# Patient Record
Sex: Male | Born: 1985
Health system: Southern US, Community
[De-identification: ages and names within clinical notes are randomized; demographics above are authoritative.]

## PROBLEM LIST (undated history)

## (undated) DIAGNOSIS — T07XXXA Unspecified multiple injuries, initial encounter: Secondary | ICD-10-CM

## (undated) DIAGNOSIS — S0285XA Fracture of orbit, unspecified, initial encounter for closed fracture: Secondary | ICD-10-CM

## (undated) DIAGNOSIS — S025XXA Fracture of tooth (traumatic), initial encounter for closed fracture: Secondary | ICD-10-CM

## (undated) DIAGNOSIS — S0010XA Contusion of unspecified eyelid and periocular area, initial encounter: Secondary | ICD-10-CM

---

## 2011-09-17 DIAGNOSIS — T07XXXA Unspecified multiple injuries, initial encounter: Secondary | ICD-10-CM

## 2011-09-17 DIAGNOSIS — S0010XA Contusion of unspecified eyelid and periocular area, initial encounter: Secondary | ICD-10-CM

## 2011-09-17 DIAGNOSIS — S0285XA Fracture of orbit, unspecified, initial encounter for closed fracture: Secondary | ICD-10-CM | POA: Insufficient documentation

## 2011-09-17 DIAGNOSIS — S025XXA Fracture of tooth (traumatic), initial encounter for closed fracture: Secondary | ICD-10-CM

## 2011-09-17 HISTORY — DX: Fracture of tooth (traumatic), initial encounter for closed fracture: S02.5XXA

## 2011-09-17 HISTORY — DX: Unspecified multiple injuries, initial encounter: T07.XXXA

## 2011-09-17 HISTORY — DX: Fracture of orbit, unspecified, initial encounter for closed fracture: S02.85XA

## 2011-09-17 HISTORY — DX: Contusion of unspecified eyelid and periocular area, initial encounter: S00.10XA

## 2011-09-18 ENCOUNTER — Emergency Department (HOSPITAL_COMMUNITY): Payer: BC Managed Care – PPO

## 2011-09-18 ENCOUNTER — Emergency Department (HOSPITAL_COMMUNITY)
Admission: EM | Admit: 2011-09-18 | Discharge: 2011-09-18 | Disposition: A | Discharge status: Home / Self Care | Payer: BC Managed Care – PPO | Attending: Emergency Medicine | Admitting: Emergency Medicine

## 2011-09-18 ENCOUNTER — Encounter: Payer: Self-pay | Admitting: Emergency Medicine

## 2011-09-18 DIAGNOSIS — IMO0002 Reserved for concepts with insufficient information to code with codable children: Secondary | ICD-10-CM | POA: Insufficient documentation

## 2011-09-18 DIAGNOSIS — W010XXA Fall on same level from slipping, tripping and stumbling without subsequent striking against object, initial encounter: Secondary | ICD-10-CM | POA: Insufficient documentation

## 2011-09-18 DIAGNOSIS — S025XXA Fracture of tooth (traumatic), initial encounter for closed fracture: Secondary | ICD-10-CM | POA: Insufficient documentation

## 2011-09-18 DIAGNOSIS — S0081XA Abrasion of other part of head, initial encounter: Secondary | ICD-10-CM

## 2011-09-18 DIAGNOSIS — S022XXA Fracture of nasal bones, initial encounter for closed fracture: Secondary | ICD-10-CM | POA: Insufficient documentation

## 2011-09-18 DIAGNOSIS — S0230XA Fracture of orbital floor, unspecified side, initial encounter for closed fracture: Secondary | ICD-10-CM | POA: Insufficient documentation

## 2011-09-18 DIAGNOSIS — F1010 Alcohol abuse, uncomplicated: Secondary | ICD-10-CM | POA: Insufficient documentation

## 2011-09-18 MED ORDER — AMOXICILLIN 500 MG PO CAPS: 500.0000 mg | ORAL_CAPSULE | Freq: Three times a day (TID) | ORAL | Status: DC

## 2011-09-18 MED ORDER — BACITRACIN ZINC 500 UNIT/GM EX OINT
TOPICAL_OINTMENT | Freq: Two times a day (BID) | CUTANEOUS | Status: DC
  Administered 2011-09-18: 1 via TOPICAL
  Filled 2011-09-18: qty 0.9

## 2011-09-18 MED ORDER — OXYCODONE-ACETAMINOPHEN 5-325 MG PO TABS: 1.0000 | ORAL_TABLET | Freq: Four times a day (QID) | ORAL | Status: DC | PRN
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
## 2011-09-18 NOTE — ED Provider Notes (Signed)
Medical screening examination/treatment/procedure(s) were conducted as a shared visit with non-physician practitioner(s) and myself.  I personally evaluated the patient during the encounter  Dental procedure: Rennis Harding class 2 fracture of upper incisors dycal placed to each tooth without any immediate complications  Joya Gaskins, MD 09/18/11 [phone]

## 2011-09-18 NOTE — ED Notes (Signed)
Pt given discharge instructions and verbalizes understanding  
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
## 2011-09-21 ENCOUNTER — Other Ambulatory Visit: Payer: Self-pay | Admitting: Specialist

## 2011-09-21 ENCOUNTER — Encounter (HOSPITAL_BASED_OUTPATIENT_CLINIC_OR_DEPARTMENT_OTHER): Payer: Self-pay | Admitting: *Deleted

## 2011-09-21 NOTE — H&P (Signed)
Lenny Estala is an 26 y.o. male.   Chief Complaint: Right blow out fracture HPI:   No past medical history on file.  No past surgical history on file.  No family history on file. Social History:  does not have a smoking history on file. He does not have any smokeless tobacco history on file. He reports that he drinks alcohol. He reports that he does not use illicit drugs.  Allergies:  Allergies  Allergen Reactions  . Zithromax (Azithromycin Dihydrate)     Medications Prior to Admission  Medication Sig Dispense Refill  . amoxicillin (AMOXIL) 500 MG capsule Take 1 capsule (500 mg total) by mouth 3 (three) times daily.  21 capsule  0  . oxyCODONE-acetaminophen (PERCOCET) 5-325 MG per tablet Take 1-2 tablets by mouth every 6 (six) hours as needed for pain.  20 tablet  0   No current facility-administered medications on file as of 09/21/2011.    No results found for this or any previous visit (from the past 48 hour(s)). No results found.  ROS  There were no vitals taken for this visit. Physical Exam   Assessment/Plan   Gwendolynn Merkey L 09/21/2011, 3:05 PM    

## 2011-09-24 ENCOUNTER — Ambulatory Visit (HOSPITAL_BASED_OUTPATIENT_CLINIC_OR_DEPARTMENT_OTHER): Payer: BC Managed Care – PPO | Admitting: *Deleted

## 2011-09-24 ENCOUNTER — Encounter (HOSPITAL_BASED_OUTPATIENT_CLINIC_OR_DEPARTMENT_OTHER)
Admission: RE | Disposition: A | Discharge status: Home / Self Care | Payer: Self-pay | Source: Ambulatory Visit | Attending: Specialist

## 2011-09-24 ENCOUNTER — Encounter (HOSPITAL_BASED_OUTPATIENT_CLINIC_OR_DEPARTMENT_OTHER): Payer: Self-pay | Admitting: *Deleted

## 2011-09-24 ENCOUNTER — Encounter (HOSPITAL_BASED_OUTPATIENT_CLINIC_OR_DEPARTMENT_OTHER): Payer: Self-pay | Admitting: Specialist

## 2011-09-24 ENCOUNTER — Ambulatory Visit (HOSPITAL_BASED_OUTPATIENT_CLINIC_OR_DEPARTMENT_OTHER)
Admission: RE | Admit: 2011-09-24 | Discharge: 2011-09-24 | Disposition: A | Discharge status: Home / Self Care | Payer: BC Managed Care – PPO | Source: Ambulatory Visit | Attending: Specialist | Admitting: Specialist

## 2011-09-24 DIAGNOSIS — W19XXXA Unspecified fall, initial encounter: Secondary | ICD-10-CM | POA: Insufficient documentation

## 2011-09-24 DIAGNOSIS — S0230XA Fracture of orbital floor, unspecified side, initial encounter for closed fracture: Secondary | ICD-10-CM | POA: Insufficient documentation

## 2011-09-24 HISTORY — DX: Fracture of tooth (traumatic), initial encounter for closed fracture: S02.5XXA

## 2011-09-24 HISTORY — DX: Contusion of unspecified eyelid and periocular area, initial encounter: S00.10XA

## 2011-09-24 HISTORY — DX: Unspecified multiple injuries, initial encounter: T07.XXXA

## 2011-09-24 HISTORY — PX: ORIF ZYGOMATIC FRACTURE: SHX2134

## 2011-09-24 HISTORY — DX: Fracture of orbit, unspecified, initial encounter for closed fracture: S02.85XA

## 2011-09-24 SURGERY — OPEN REDUCTION INTERNAL FIXATION (ORIF) ZYGOMATIC FRACTURE
Anesthesia: General | Laterality: Right | Wound class: Clean

## 2011-09-24 MED ORDER — PROMETHAZINE HCL 25 MG/ML IJ SOLN: 6.2500 mg | INTRAMUSCULAR | Status: DC | PRN

## 2011-09-24 MED ORDER — LIDOCAINE HCL (CARDIAC) 20 MG/ML IV SOLN
INTRAVENOUS | Status: DC | PRN
  Administered 2011-09-24: 60 mg via INTRAVENOUS

## 2011-09-24 MED ORDER — GLYCOPYRROLATE 0.2 MG/ML IJ SOLN
INTRAMUSCULAR | Status: DC | PRN
  Administered 2011-09-24: .4 mg via INTRAVENOUS

## 2011-09-24 MED ORDER — HYDROMORPHONE HCL PF 1 MG/ML IJ SOLN
0.2500 mg | INTRAMUSCULAR | Status: DC | PRN
  Administered 2011-09-24 (×2): 0.5 mg via INTRAVENOUS

## 2011-09-24 MED ORDER — MEPERIDINE HCL 25 MG/ML IJ SOLN: 6.2500 mg | INTRAMUSCULAR | Status: DC | PRN

## 2011-09-24 MED ORDER — CEFAZOLIN SODIUM 1-5 GM-% IV SOLN
1.0000 g | INTRAVENOUS | Status: AC
  Administered 2011-09-24: 2 g via INTRAVENOUS

## 2011-09-24 MED ORDER — LIDOCAINE-EPINEPHRINE 0.5-1:200000 % IJ SOLN
INTRAMUSCULAR | Status: DC | PRN
  Administered 2011-09-24: 10 mL

## 2011-09-24 MED ORDER — FENTANYL CITRATE 0.05 MG/ML IJ SOLN
INTRAMUSCULAR | Status: DC | PRN
  Administered 2011-09-24: 100 ug via INTRAVENOUS

## 2011-09-24 MED ORDER — PROPOFOL 10 MG/ML IV EMUL
INTRAVENOUS | Status: DC | PRN
  Administered 2011-09-24: 200 mg via INTRAVENOUS

## 2011-09-24 MED ORDER — OXYCODONE-ACETAMINOPHEN 5-325 MG PO TABS
1.0000 | ORAL_TABLET | Freq: Once | ORAL | Status: AC
  Administered 2011-09-24: 1 via ORAL

## 2011-09-24 MED ORDER — MORPHINE SULFATE 2 MG/ML IJ SOLN: 0.0500 mg/kg | INTRAMUSCULAR | Status: DC | PRN

## 2011-09-24 MED ORDER — ROCURONIUM BROMIDE 100 MG/10ML IV SOLN
INTRAVENOUS | Status: DC | PRN
  Administered 2011-09-24: 40 mg via INTRAVENOUS

## 2011-09-24 MED ORDER — DEXTROSE IN LACTATED RINGERS 5 % IV SOLN: INTRAVENOUS | Status: DC

## 2011-09-24 MED ORDER — LACTATED RINGERS IV SOLN
INTRAVENOUS | Status: DC
  Administered 2011-09-24: 10:00:00 via INTRAVENOUS

## 2011-09-24 MED ORDER — ONDANSETRON HCL 4 MG/2ML IJ SOLN
INTRAMUSCULAR | Status: DC | PRN
  Administered 2011-09-24: 4 mg via INTRAVENOUS

## 2011-09-24 MED ORDER — DEXAMETHASONE SODIUM PHOSPHATE 4 MG/ML IJ SOLN
INTRAMUSCULAR | Status: DC | PRN
  Administered 2011-09-24: 10 mg via INTRAVENOUS

## 2011-09-24 MED ORDER — NEOSTIGMINE METHYLSULFATE 1 MG/ML IJ SOLN
INTRAMUSCULAR | Status: DC | PRN
  Administered 2011-09-24: 3 mg via INTRAVENOUS

## 2011-09-24 MED ORDER — MIDAZOLAM HCL 5 MG/5ML IJ SOLN
INTRAMUSCULAR | Status: DC | PRN
  Administered 2011-09-24: 2 mg via INTRAVENOUS

## 2011-09-24 SURGICAL SUPPLY — 26 items
BLADE KNIFE PERSONA 15 (BLADE) ×2 IMPLANT
CANISTER SUCTION 1200CC (MISCELLANEOUS) ×2 IMPLANT
CLOTH BEACON ORANGE TIMEOUT ST (SAFETY) ×2 IMPLANT
CONFORMERS SILICONE 5649 ×2 IMPLANT
COVER MAYO STAND STRL (DRAPES) ×2 IMPLANT
DRAPE U-SHAPE 76X120 STRL (DRAPES) ×2 IMPLANT
ELECT NEEDLE TIP 2.8 STRL (NEEDLE) ×2 IMPLANT
ELECT REM PT RETURN 9FT ADLT (ELECTROSURGICAL) ×2
ELECTRODE REM PT RTRN 9FT ADLT (ELECTROSURGICAL) ×1 IMPLANT
GLOVE BIO SURGEON STRL SZ 6.5 (GLOVE) ×2 IMPLANT
GLOVE BIOGEL M STRL SZ7.5 (GLOVE) ×2 IMPLANT
GLOVE INDICATOR 8.0 STRL GRN (GLOVE) ×2 IMPLANT
GOWN PREVENTION PLUS XXLARGE (GOWN DISPOSABLE) ×4 IMPLANT
NEEDLE HYPO 25X1 1.5 SAFETY (NEEDLE) ×2 IMPLANT
NS IRRIG 1000ML POUR BTL (IV SOLUTION) ×2 IMPLANT
PACK BASIN DAY SURGERY FS (CUSTOM PROCEDURE TRAY) ×2 IMPLANT
SHEET SIL 8X6X.020 ×2 IMPLANT
SPONGE GAUZE 4X4 12PLY (GAUZE/BANDAGES/DRESSINGS) ×2 IMPLANT
STRIP CLOSURE SKIN 1/4X4 (GAUZE/BANDAGES/DRESSINGS) ×2 IMPLANT
SUCTION FRAZIER TIP 10 FR DISP (SUCTIONS) ×2 IMPLANT
SUT MON AB 5-0 PS2 18 (SUTURE) ×2 IMPLANT
SUT SILK 6 0 P 1 (SUTURE) ×2 IMPLANT
SYR BULB 3OZ (MISCELLANEOUS) ×2 IMPLANT
SYR CONTROL 10ML LL (SYRINGE) ×2 IMPLANT
TUBE CONNECTING 20X1/4 (TUBING) ×2 IMPLANT
WATER STERILE IRR 1000ML POUR (IV SOLUTION) ×2 IMPLANT

## 2011-09-24 NOTE — Interval H&P Note (Signed)
History and Physical Interval Note:  09/24/2011 11:29 AM  Dennis Bennett  has presented today for surgery, with the diagnosis of right blowout fracture   The various methods of treatment have been discussed with the patient and family. After consideration of risks, benefits and other options for treatment, the patient has consented to  Procedure(s): OPEN REDUCTION INTERNAL FIXATION (ORIF) ZYGOMATIC FRACTURE as a surgical intervention .  The patients' history has been reviewed, patient examined, no change in status, stable for surgery.  I have reviewed the patients' chart and labs.  Questions were answered to the patient's satisfaction.     [name] L

## 2011-09-24 NOTE — Brief Op Note (Signed)
09/24/2011  12:27 PM  PATIENT:  Dennis Bennett  26 y.o. male  PRE-OPERATIVE DIAGNOSIS:  right blowout fracture   POST-OPERATIVE DIAGNOSIS:  right blowout fracture   PROCEDURE:  Procedure(s): OPEN REDUCTION INTERNAL FIXATION (ORIF) ZYGOMATIC FRACTURE  SURGEON:  Surgeon(s): Yaakov Guthrie Calee Nugent  PHYSICIAN ASSISTANT:   ASSISTANTS: none   ANESTHESIA:   general  ebl 50 cc  BLOOD ADMINISTERED:00000cc CC PRBC  DRAINS: none   LOCAL MEDICATIONS USED:  LIDOCAINE 10CC  SPECIMEN:  No Specimen  DISPOSITION OF SPECIMEN:  N/A  COUNTS:  YES  TOURNIQUET:  * No tourniquets in log *  DICTATION: .Other Dictation: Dictation Number [id number]  PLAN OF CARE: Discharge to home after PACU  PATIENT DISPOSITION:  PACU - hemodynamically stable.   Delay start of Pharmacological VTE agent (>24hrs) due to surgical blood loss or risk of bleeding:  {YES/NO/NOT APPLICABLE:20182

## 2011-09-24 NOTE — Transfer of Care (Signed)
Immediate Anesthesia Transfer of Care Note  Patient: Dennis Bennett  Procedure(s) Performed:  OPEN REDUCTION INTERNAL FIXATION (ORIF) ZYGOMATIC FRACTURE - open reduction right blowout fracture  Patient Location: PACU  Anesthesia Type: General  Level of Consciousness: awake  Airway & Oxygen Therapy: Patient Spontanous Breathing and Patient connected to face mask oxygen  Post-op Assessment: Report given to PACU RN and Post -op Vital signs reviewed and stable  Post vital signs: Reviewed and stable  Complications: No apparent anesthesia complications

## 2011-09-24 NOTE — H&P (View-Only) (Signed)
Dennis Bennett is an 26 y.o. male.   Chief Complaint: Right blow out fracture HPI:   No past medical history on file.  No past surgical history on file.  No family history on file. Social History:  does not have a smoking history on file. He does not have any smokeless tobacco history on file. He reports that he drinks alcohol. He reports that he does not use illicit drugs.  Allergies:  Allergies  Allergen Reactions  . Zithromax (Azithromycin Dihydrate)     Medications Prior to Admission  Medication Sig Dispense Refill  . amoxicillin (AMOXIL) 500 MG capsule Take 1 capsule (500 mg total) by mouth 3 (three) times daily.  21 capsule  0  . oxyCODONE-acetaminophen (PERCOCET) 5-325 MG per tablet Take 1-2 tablets by mouth every 6 (six) hours as needed for pain.  20 tablet  0   No current facility-administered medications on file as of 09/21/2011.    No results found for this or any previous visit (from the past 48 hour(s)). No results found.  ROS  There were no vitals taken for this visit. Physical Exam   Assessment/Plan   Shoni Quijas L 09/21/2011, 3:05 PM

## 2011-09-24 NOTE — Anesthesia Preprocedure Evaluation (Addendum)
Anesthesia Evaluation  Patient identified by MRN, date of birth, ID band Patient awake    Reviewed: Allergy & Precautions, H&P , NPO status , Patient's Chart, lab work & pertinent test results  Airway Mallampati: II      Dental  (+)    Pulmonary asthma ,  clear to auscultation        Cardiovascular neg cardio ROS Regular Normal    Neuro/Psych    GI/Hepatic negative GI ROS,   Endo/Other  Negative Endocrine ROS  Renal/GU negative Renal ROS     Musculoskeletal negative musculoskeletal ROS (+)   Abdominal   Peds  Hematology negative hematology ROS (+)   Anesthesia Other Findings Both upper front teeth broken off "in accident"  Reproductive/Obstetrics                          Anesthesia Physical Anesthesia Plan  ASA: II  Anesthesia Plan: General   Post-op Pain Management:    Induction: Intravenous  Airway Management Planned: Oral ETT  Additional Equipment:   Intra-op Plan:   Post-operative Plan: Extubation in OR  Informed Consent: I have reviewed the patients History and Physical, chart, labs and discussed the procedure including the risks, benefits and alternatives for the proposed anesthesia with the patient or authorized representative who has indicated his/her understanding and acceptance.   Dental Advisory Given  Plan Discussed with: CRNA  Anesthesia Plan Comments:       Anesthesia Quick Evaluation

## 2011-09-24 NOTE — Anesthesia Postprocedure Evaluation (Signed)
  Anesthesia Post-op Note  Patient: Dennis Bennett  Procedure(s) Performed:  OPEN REDUCTION INTERNAL FIXATION (ORIF) ZYGOMATIC FRACTURE - open reduction right blowout fracture  Patient Location: PACU  Anesthesia Type: General  Level of Consciousness: awake  Airway and Oxygen Therapy: Patient Spontanous Breathing  Post-op Pain: mild  Post-op Assessment: Post-op Vital signs reviewed  Post-op Vital Signs: stable  Complications: No apparent anesthesia complications

## 2011-09-24 NOTE — Anesthesia Procedure Notes (Signed)
Procedure Name: Intubation Performed by: Sharyne Richters Pre-anesthesia Checklist: Patient identified, Timeout performed, Emergency Drugs available, Suction available and Patient being monitored Patient Re-evaluated:Patient Re-evaluated prior to inductionOxygen Delivery Method: Circle System Utilized Preoxygenation: Pre-oxygenation with 100% oxygen Intubation Type: IV induction Ventilation: Mask ventilation without difficulty Laryngoscope Size: Miller and 2 Grade View: Grade I Tube type: Oral Tube size: 8.0 mm Number of attempts: 1 Placement Confirmation: ETT inserted through vocal cords under direct vision,  breath sounds checked- equal and bilateral and positive ETCO2 Secured at: 23 cm Tube secured with: Tape Dental Injury: Teeth and Oropharynx as per pre-operative assessment

## 2011-09-25 ENCOUNTER — Encounter (HOSPITAL_BASED_OUTPATIENT_CLINIC_OR_DEPARTMENT_OTHER): Payer: Self-pay | Admitting: Specialist

## 2011-09-25 LAB — POCT HEMOGLOBIN-HEMACUE: Hemoglobin: 16 g/dL (ref 13.0–17.0)
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
## 2012-06-07 IMAGING — CT CT MAXILLOFACIAL W/O CM
1 series · 15 of 30 positions shown, 19 images · non-contrast
Comparison: None.

CLINICAL DATA: The patient fell and hit the right side of the face.
Pain and swelling over the right eye and right face.

CT MAXILLOFACIAL WITHOUT CONTRAST
TECHNIQUE: Multidetector CT imaging of the maxillofacial
structures was performed. Multiplanar CT image reconstructions were
also generated.

[Series 3: facial st · axial · 0.37mm/px · z∈[-315,-159]mm · 15 of 84 slices shown, 19 images]
[im 3/84  brain]
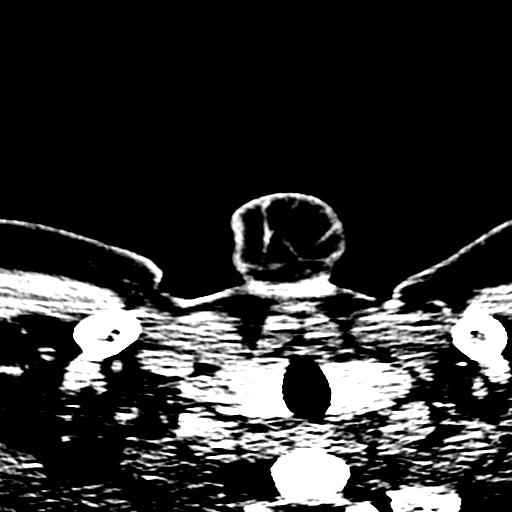
[im 3/84  bone]
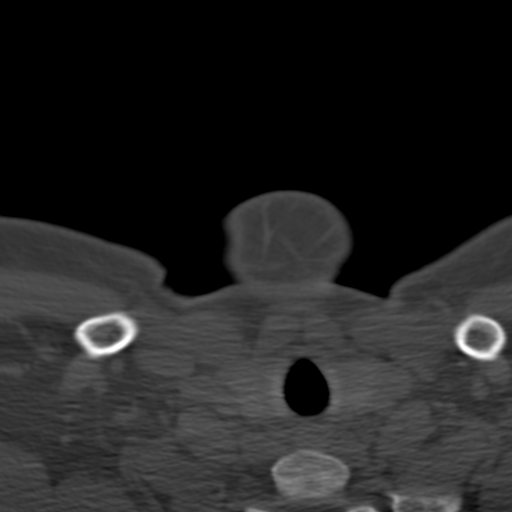
[im 9/84  bone]
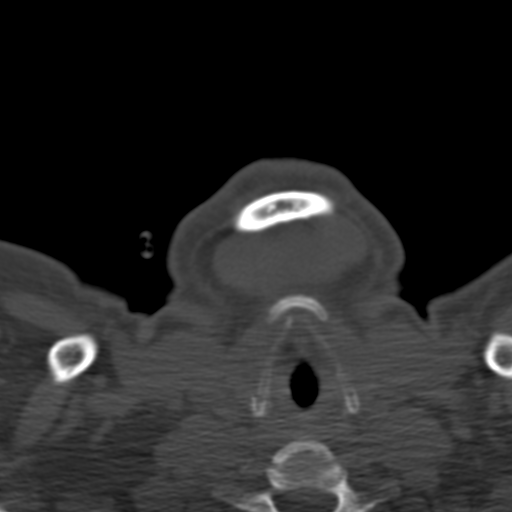
[im 15/84  bone]
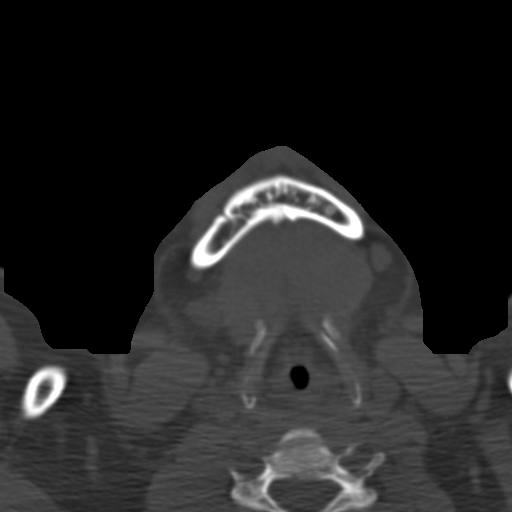
[im 21/84  bone]
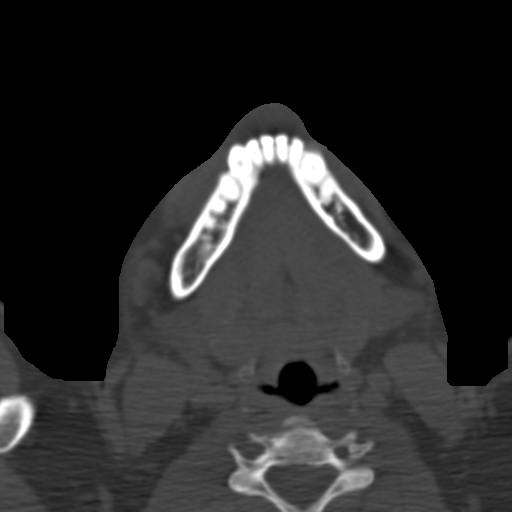
[im 26/84  brain]
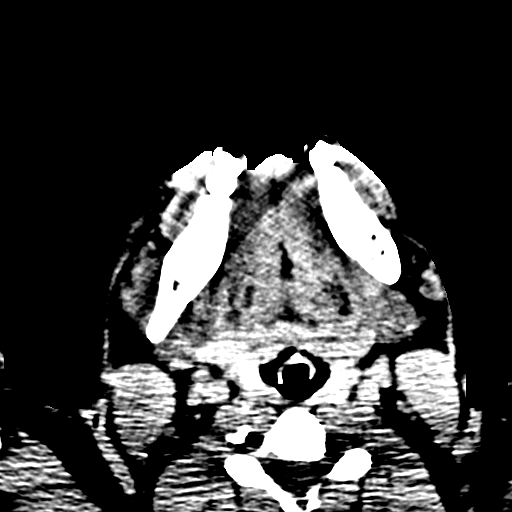
[im 26/84  bone]
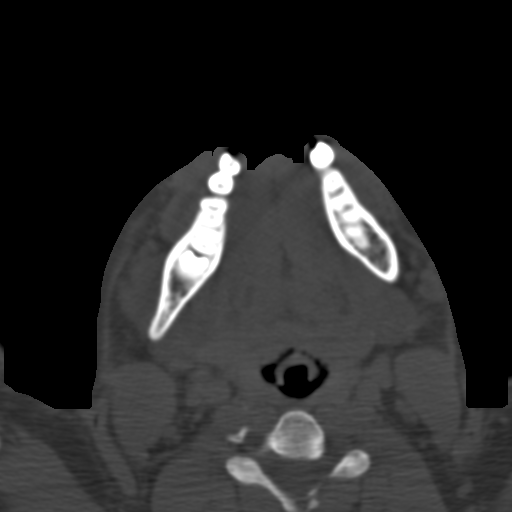
[im 32/84  bone]
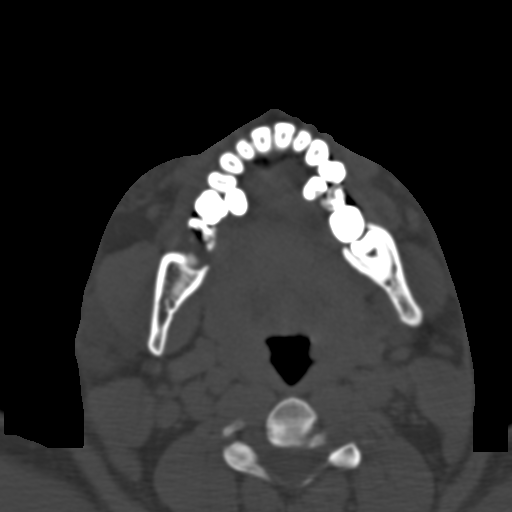
[im 38/84  bone]
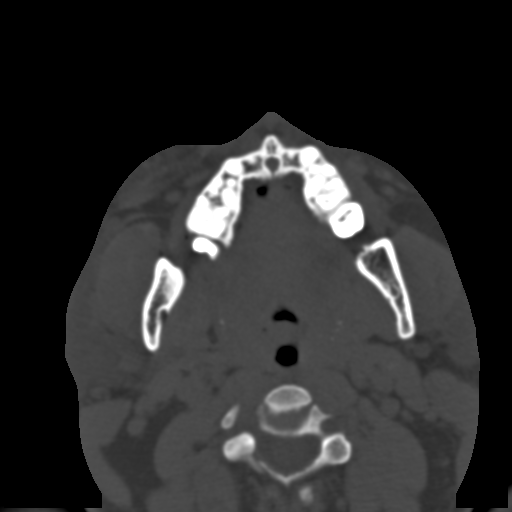
[im 43/84  bone]
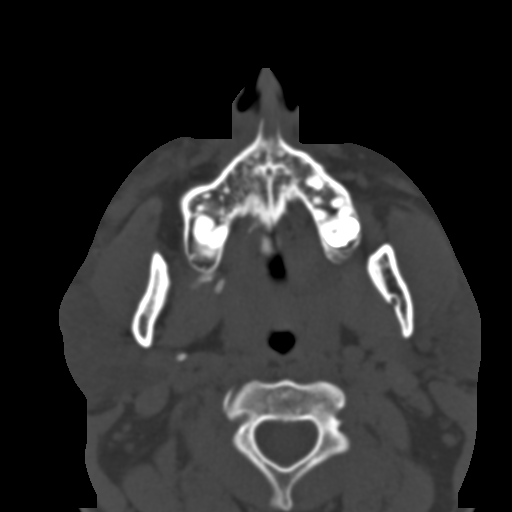
[im 46/84  brain]
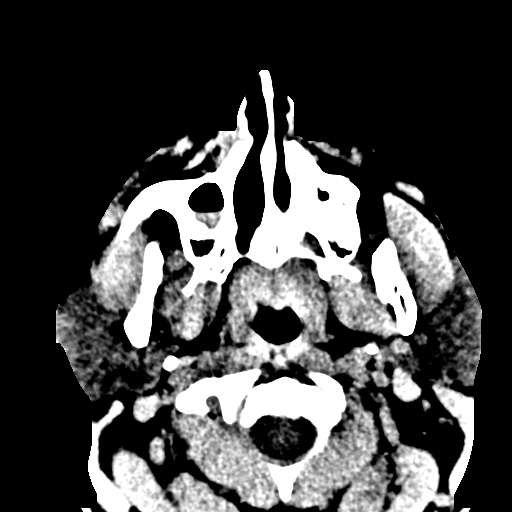
[im 46/84  bone]
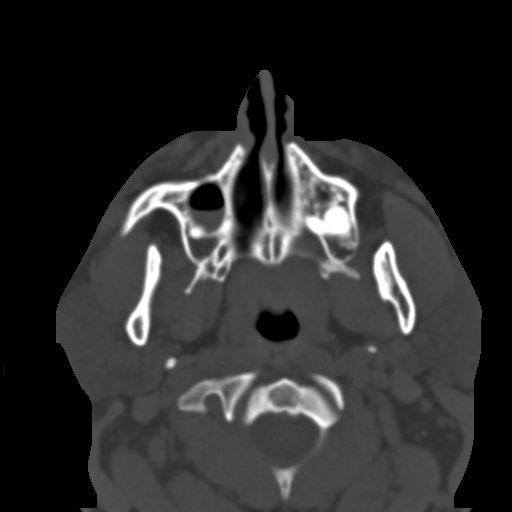
[im 52/84  bone]
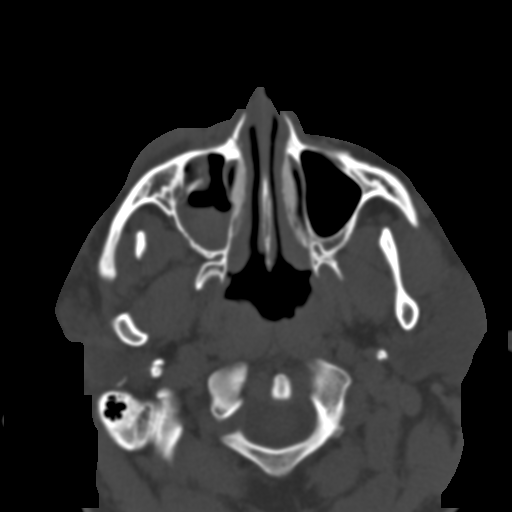
[im 58/84  bone]
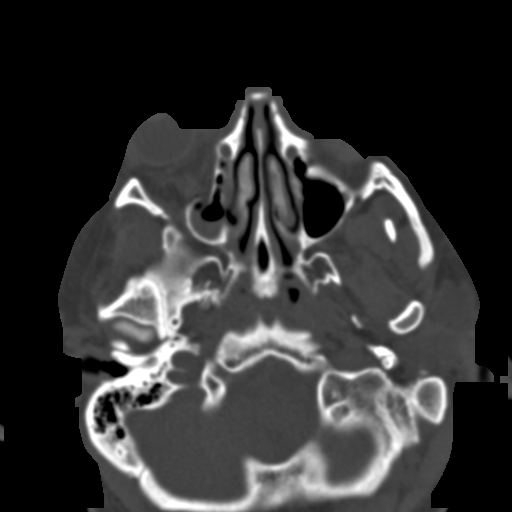
[im 63/84  bone]
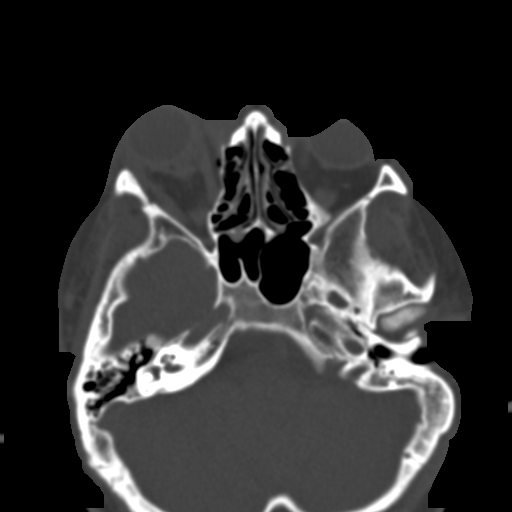
[im 69/84  brain]
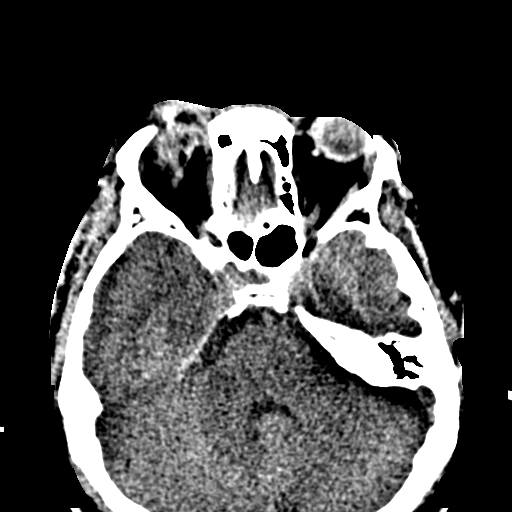
[im 69/84  bone]
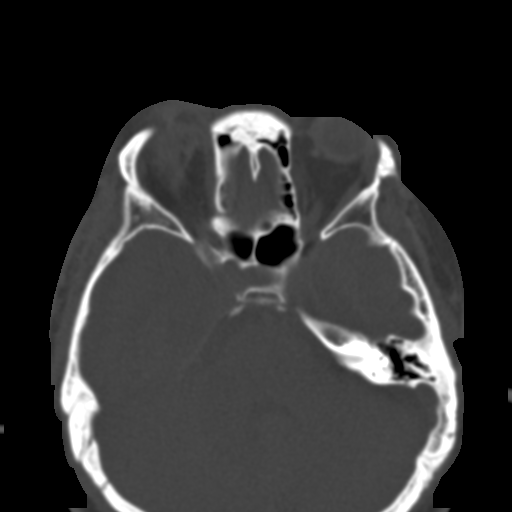
[im 75/84  bone]
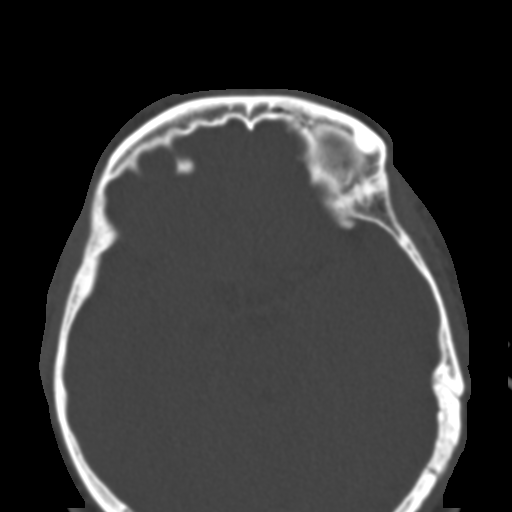
[im 81/84  bone]
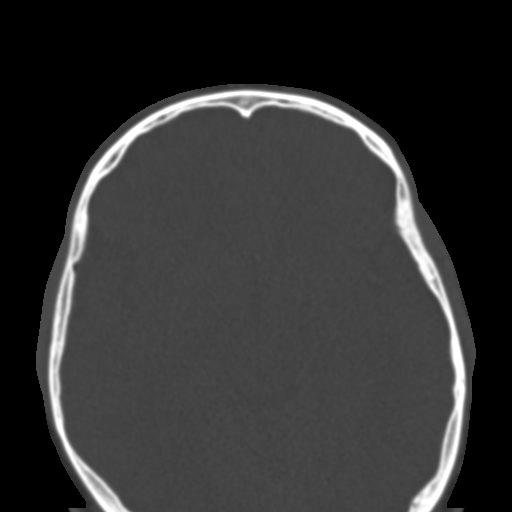

[15 of 30 positions shown; findings below may reference images not displayed]

FINDINGS: There is right periorbital soft tissue swelling with mild
postseptal retrobulbar fatty infiltration.  Small gas collection
along the medial extraconal spaces.  Air-fluid level in the right
maxillary antrum.  Depressed blowout fractures of the medial and
inferior right orbital wall with herniation of fat into the right
maxillary antrum and focal herniation of the medial rectus muscle
into the medial orbital wall defect. Mildly displaced nasal bone
fractures bilaterally.  The left orbital rim, left maxillary antral
walls, nasal septum, mandibles, temporomandibular joints, zygomatic
arches, and pterygoid plates are intact.
IMPRESSION: Right medial and inferior orbital wall blowout fractures with fat
herniation inferiorly and focal herniation of the medial rectus
muscle into the medial orbital wall defect.  Right periorbital soft
tissue swelling with infiltration in the retrobulbar fat.
Displaced nasal bone fractures.

## 2015-10-31 ENCOUNTER — Emergency Department (HOSPITAL_COMMUNITY)
Admission: EM | Admit: 2015-10-31 | Discharge: 2015-10-31 | Disposition: A | Discharge status: Home / Self Care | Payer: 59 | Attending: Emergency Medicine | Admitting: Emergency Medicine

## 2015-10-31 ENCOUNTER — Encounter (HOSPITAL_COMMUNITY): Payer: Self-pay | Admitting: *Deleted

## 2015-10-31 DIAGNOSIS — K1379 Other lesions of oral mucosa: Secondary | ICD-10-CM

## 2015-10-31 DIAGNOSIS — R042 Hemoptysis: Secondary | ICD-10-CM | POA: Diagnosis present

## 2015-10-31 NOTE — ED Notes (Signed)
Pt woke with blood on lip and on his pillow and when he spit he had blood come out.  No pain anywhere.  Never happened before. Pt states lower left back was hurting last week. No other complaints, no seizure history

## 2015-10-31 NOTE — ED Notes (Signed)
EDP at the bedside.  ?

## 2015-10-31 NOTE — ED Provider Notes (Signed)
CSN: [id number]     Arrival date & time 10/31/15  1033 History   First MD Initiated Contact with Patient 10/31/15 1213     Chief Complaint  Patient presents with  . Blood in mouth      (Consider location/radiation/quality/duration/timing/severity/associated sxs/prior Treatment) HPI Comments: 30 year old male with no significant medical history nonsmoker presents because he woke up with blood on his pillow. Spit-up blood once. Patient is never done this before. No travel or sick contacts, no contacts with TB or personal history of TB. Patient has had no episodes since. Patient has no shortness of breath and no symptoms currently. No seizure history. Patient did not bite his tongue.  The history is provided by the patient.    History reviewed. No pertinent past medical history. History reviewed. No pertinent past surgical history. No family history on file. Social History  Substance Use Topics  . Smoking status: Never Smoker   . Smokeless tobacco: None  . Alcohol Use: Yes     Comment: Occasional    Review of Systems  Constitutional: Negative for fever and chills.  HENT: Negative for congestion.   Eyes: Negative for visual disturbance.  Respiratory: Negative for shortness of breath.   Cardiovascular: Negative for chest pain.  Gastrointestinal: Negative for vomiting and abdominal pain.  Genitourinary: Negative for dysuria and flank pain.  Musculoskeletal: Negative for back pain, neck pain and neck stiffness.  Skin: Negative for rash.  Neurological: Negative for light-headedness and headaches.      Allergies  Zithromax  Home Medications   Prior to Admission medications   Not on File   BP 148/91 mmHg  Pulse 99  Temp(Src) 99 F (37.2 C) (Oral)  Resp 18  SpO2 98% Physical Exam  Constitutional: He appears well-developed and well-nourished. No distress.  HENT:  Head: Normocephalic and atraumatic.  No obvious lesions or lacerations the oropharynx buccal mucosa or  tongue.  Eyes: Conjunctivae are normal.  Neck: Neck supple.  Cardiovascular: Normal rate.   Pulmonary/Chest: Effort normal and breath sounds normal.  Nursing note and vitals reviewed.   ED Course  Procedures (including critical care time) Labs Review Labs Reviewed - No data to display  Imaging Review No results found. I have personally reviewed and evaluated these images and lab results as part of my medical decision-making.   EKG Interpretation None      MDM   Final diagnoses:  Blood in mouth of unknown source   Patient presents after one episode of spitting blood. Patient does not have seizure history and woke up feeling fine otherwise. Patient stable for close outpatient follow-up. No obvious source of bleeding at this time. Results and differential diagnosis were discussed with the patient/parent/guardian. Xrays were independently reviewed by myself.  Close follow up outpatient was discussed, comfortable with the plan.   Medications - No data to display  Filed Vitals:   10/31/15 1111 10/31/15 1413  BP: 148/91 138/76  Pulse: 99 81  Temp: 99 F (37.2 C)   TempSrc: Oral   Resp: 18 18  SpO2: 98% 98%    Final diagnoses:  Blood in mouth of unknown source         Blane Ohara, MD 10/31/15 1703

## 2015-10-31 NOTE — Discharge Instructions (Signed)
If you were given medicines take as directed.  If you are on coumadin or contraceptives realize their levels and effectiveness is altered by many different medicines.  If you have any reaction (rash, tongues swelling, other) to the medicines stop taking and see a physician.    If your blood pressure was elevated in the ER make sure you follow up for management with a primary doctor or return for chest pain, shortness of breath or stroke symptoms.  Please follow up as directed and return to the ER or see a physician for new or worsening symptoms.  Thank you. Filed Vitals:   10/31/15 1111  BP: 148/91  Pulse: 99  Temp: 99 F (37.2 C)  TempSrc: Oral  Resp: 18  SpO2: 98%

## 2015-11-01 ENCOUNTER — Encounter (HOSPITAL_BASED_OUTPATIENT_CLINIC_OR_DEPARTMENT_OTHER): Payer: Self-pay | Admitting: Specialist

## 2017-03-06 DIAGNOSIS — R740 Nonspecific elevation of levels of transaminase and lactic acid dehydrogenase [LDH]: Secondary | ICD-10-CM | POA: Diagnosis not present

## 2017-03-06 DIAGNOSIS — E7800 Pure hypercholesterolemia, unspecified: Secondary | ICD-10-CM | POA: Diagnosis not present

## 2017-03-06 DIAGNOSIS — Z0000 Encounter for general adult medical examination without abnormal findings: Secondary | ICD-10-CM | POA: Diagnosis not present

## 2017-07-03 DIAGNOSIS — R740 Nonspecific elevation of levels of transaminase and lactic acid dehydrogenase [LDH]: Secondary | ICD-10-CM | POA: Diagnosis not present

## 2017-07-03 DIAGNOSIS — E7800 Pure hypercholesterolemia, unspecified: Secondary | ICD-10-CM | POA: Diagnosis not present

## 2024-04-15 ENCOUNTER — Other Ambulatory Visit: Payer: Self-pay | Admitting: Physician Assistant

## 2024-04-15 DIAGNOSIS — M25511 Pain in right shoulder: Secondary | ICD-10-CM

## 2024-04-15 DIAGNOSIS — R29898 Other symptoms and signs involving the musculoskeletal system: Secondary | ICD-10-CM

## 2024-04-28 ENCOUNTER — Encounter: Payer: Self-pay | Admitting: Physician Assistant

## 2024-05-10 ENCOUNTER — Other Ambulatory Visit
